# Patient Record
Sex: Male | Born: 1980 | Race: Black or African American | Hispanic: No | Marital: Single | Smoking: Current every day smoker
Health system: Southern US, Community
[De-identification: ages and names within clinical notes are randomized; demographics above are authoritative.]

---

## 2005-12-18 ENCOUNTER — Emergency Department (HOSPITAL_COMMUNITY): Admission: EM | Admit: 2005-12-18 | Discharge: 2005-12-18 | Payer: Self-pay | Admitting: Family Medicine

## 2006-08-25 ENCOUNTER — Emergency Department (HOSPITAL_COMMUNITY): Admission: EM | Admit: 2006-08-25 | Discharge: 2006-08-25 | Payer: Self-pay | Admitting: Family Medicine

## 2007-02-11 ENCOUNTER — Emergency Department (HOSPITAL_COMMUNITY): Admission: EM | Admit: 2007-02-11 | Discharge: 2007-02-11 | Payer: Self-pay | Admitting: Emergency Medicine

## 2007-03-25 ENCOUNTER — Emergency Department (HOSPITAL_COMMUNITY): Admission: EM | Admit: 2007-03-25 | Discharge: 2007-03-25 | Payer: Self-pay | Admitting: Emergency Medicine

## 2007-07-29 ENCOUNTER — Emergency Department (HOSPITAL_COMMUNITY): Admission: EM | Admit: 2007-07-29 | Discharge: 2007-07-29 | Payer: Self-pay | Admitting: Emergency Medicine

## 2008-01-06 ENCOUNTER — Emergency Department (HOSPITAL_COMMUNITY): Admission: EM | Admit: 2008-01-06 | Discharge: 2008-01-06 | Payer: Self-pay | Admitting: Emergency Medicine
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
## 2010-03-01 ENCOUNTER — Emergency Department (HOSPITAL_COMMUNITY): Admission: EM | Admit: 2010-03-01 | Discharge: 2010-03-01 | Payer: Self-pay | Admitting: Family Medicine

## 2010-08-18 ENCOUNTER — Ambulatory Visit (HOSPITAL_COMMUNITY)
Admission: RE | Admit: 2010-08-18 | Discharge: 2010-08-18 | Payer: Self-pay | Source: Home / Self Care | Admitting: Family Medicine

## 2011-07-17 LAB — CULTURE, ROUTINE-ABSCESS: Gram Stain: NONE SEEN

## 2016-02-19 ENCOUNTER — Encounter (HOSPITAL_BASED_OUTPATIENT_CLINIC_OR_DEPARTMENT_OTHER): Payer: Self-pay | Admitting: *Deleted

## 2016-02-19 ENCOUNTER — Emergency Department (HOSPITAL_BASED_OUTPATIENT_CLINIC_OR_DEPARTMENT_OTHER)
Admission: EM | Admit: 2016-02-19 | Discharge: 2016-02-20 | Disposition: A | Discharge status: Home / Self Care | Payer: 59 | Attending: Emergency Medicine | Admitting: Emergency Medicine

## 2016-02-19 DIAGNOSIS — F17210 Nicotine dependence, cigarettes, uncomplicated: Secondary | ICD-10-CM | POA: Insufficient documentation

## 2016-02-19 DIAGNOSIS — R369 Urethral discharge, unspecified: Secondary | ICD-10-CM | POA: Diagnosis present

## 2016-02-19 LAB — URINALYSIS, ROUTINE W REFLEX MICROSCOPIC
Bilirubin Urine: NEGATIVE
GLUCOSE, UA: NEGATIVE mg/dL
KETONES UR: NEGATIVE mg/dL
Nitrite: NEGATIVE
PROTEIN: NEGATIVE mg/dL
Specific Gravity, Urine: 1.005 (ref 1.005–1.030)
pH: 6 (ref 5.0–8.0)

## 2016-02-19 LAB — URINE MICROSCOPIC-ADD ON

## 2016-02-19 MED ORDER — LIDOCAINE HCL (PF) 1 % IJ SOLN
INTRAMUSCULAR | Status: AC
  Administered 2016-02-19: 5 mL
  Filled 2016-02-19: qty 5

## 2016-02-19 MED ORDER — AZITHROMYCIN 250 MG PO TABS
1000.0000 mg | ORAL_TABLET | Freq: Once | ORAL | Status: AC
  Administered 2016-02-19: 1000 mg via ORAL
  Filled 2016-02-19: qty 4

## 2016-02-19 MED ORDER — CEFTRIAXONE SODIUM 250 MG IJ SOLR
250.0000 mg | Freq: Once | INTRAMUSCULAR | Status: AC
  Administered 2016-02-19: 250 mg via INTRAMUSCULAR
  Filled 2016-02-19: qty 250

## 2016-02-19 NOTE — ED Notes (Signed)
C/o penile dc, increased urinary freq,  Denies burning w urination x 2 days

## 2016-02-19 NOTE — ED Notes (Signed)
Penile discharge since yesterday.  

## 2016-02-20 NOTE — ED Provider Notes (Signed)
CSN: [id number]     Arrival date & time 02/19/16  1853 History   First MD Initiated Contact with Patient 02/19/16 2306     Chief Complaint  Patient presents with  . Penile Discharge     (Consider location/radiation/quality/duration/timing/severity/associated sxs/prior Treatment) HPI Comments: 35 y.o. Male with no significant past medical history presents for penile discharge.  The patient denies other symptoms.  It does burn after he urinates but not during urination.  Reports he was having sex the other night and the condom broke and since then he has developed discharge.  No fever, chills, nausea, vomiting, abdominal pain.   History reviewed. No pertinent past medical history. History reviewed. No pertinent past surgical history. No family history on file. Social History  Substance Use Topics  . Smoking status: Current Every Day Smoker    Types: Cigarettes  . Smokeless tobacco: None  . Alcohol Use: Yes     Comment: daily    Review of Systems  Constitutional: Negative for fever and chills.  HENT: Negative for congestion.   Genitourinary: Positive for dysuria, frequency and discharge. Negative for urgency, hematuria and difficulty urinating.  Hematological: Does not bruise/bleed easily.      Allergies  Review of patient's allergies indicates no known allergies.  Home Medications   Prior to Admission medications   Not on File   BP 149/88 mmHg  Pulse 84  Temp(Src) 98.8 F (37.1 C) (Oral)  Resp 16  Ht  (1.651 m)  Wt 120 lb (54.432 kg)  BMI 19.97 kg/m2  SpO2 100% Physical Exam  Constitutional: He is oriented to person, place, and time. He appears well-developed and well-nourished. No distress.  HENT:  Head: Normocephalic and atraumatic.  Right Ear: External ear normal.  Left Ear: External ear normal.  Mouth/Throat: Oropharynx is clear and moist. No oropharyngeal exudate.  Eyes: EOM are normal. Pupils are equal, round, and reactive to light.  Neck: Normal  range of motion. Neck supple.  Cardiovascular: Normal rate, regular rhythm, normal heart sounds and intact distal pulses.   No murmur heard. Pulmonary/Chest: Effort normal. No respiratory distress. He has no wheezes. He has no rales.  Abdominal: Soft. He exhibits no distension. There is no tenderness.  Genitourinary: Testes normal. No penile erythema or penile tenderness. Discharge (thick, yellowish) found.  Musculoskeletal: He exhibits no edema.  Neurological: He is alert and oriented to person, place, and time.  Skin: Skin is warm and dry. No rash noted. He is not diaphoretic.  Vitals reviewed.   ED Course  Procedures (including critical care time) Labs Review Labs Reviewed  URINALYSIS, ROUTINE W REFLEX MICROSCOPIC (NOT AT [facility]) - Abnormal; Notable for the following:    APPearance CLOUDY (*)    Hgb urine dipstick SMALL (*)    Leukocytes, UA LARGE (*)    All other components within normal limits  URINE MICROSCOPIC-ADD ON - Abnormal; Notable for the following:    Squamous Epithelial / LPF 0-5 (*)    Bacteria, UA MANY (*)    All other components within normal limits  GC/CHLAMYDIA PROBE AMP ([place]) NOT AT [facility]    Imaging Review No results found. I have personally reviewed and evaluated these images and lab results as part of my medical decision-making.   EKG Interpretation None      MDM  Patient seen and evaluated in stable condition.  UA with bacteria and WBCs likely secondary to STD.  Patient treated with Rocephin and Azithromycin.  GC/Chlamydia cultures sent.  Patient  discharged home with instruction to inform sexual partners. Final diagnoses:  Penile discharge    1. Treatment for STD    Leta BaptistEmily Roe Asheley Hellberg, MD 02/20/16 [phone]

## 2016-02-20 NOTE — Discharge Instructions (Signed)
You were seen and evaluated today for your discharge. Likely this is related to an STD. You have been treated with antibiotics in the emergency department for STDs. Please inform any sexual partners that you have that they should be tested.  Sexually Transmitted Disease A sexually transmitted disease (STD) is a disease or infection that may be passed (transmitted) from person to person, usually during sexual activity. This may happen by way of saliva, semen, blood, vaginal mucus, or urine. Common STDs include:  Gonorrhea.  Chlamydia.  Syphilis.  HIV and AIDS.  Genital herpes.  Hepatitis B and C.  Trichomonas.  Human papillomavirus (HPV).  Pubic lice.  Scabies.  Mites.  Bacterial vaginosis. WHAT ARE CAUSES OF STDs? An STD may be caused by bacteria, a virus, or parasites. STDs are often transmitted during sexual activity if one person is infected. However, they may also be transmitted through nonsexual means. STDs may be transmitted after:   Sexual intercourse with an infected person.  Sharing sex toys with an infected person.  Sharing needles with an infected person or using unclean piercing or tattoo needles.  Having intimate contact with the genitals, mouth, or rectal areas of an infected person.  Exposure to infected fluids during birth. WHAT ARE THE SIGNS AND SYMPTOMS OF STDs? Different STDs have different symptoms. Some people may not have any symptoms. If symptoms are present, they may include:  Painful or bloody urination.  Pain in the pelvis, abdomen, vagina, anus, throat, or eyes.  A skin rash, itching, or irritation.  Growths, ulcerations, blisters, or sores in the genital and anal areas.  Abnormal vaginal discharge with or without bad odor.  Penile discharge in men.  Fever.  Pain or bleeding during sexual intercourse.  Swollen glands in the groin area.  Yellow skin and eyes (jaundice). This is seen with hepatitis.  Swollen  testicles.  Infertility.  Sores and blisters in the mouth. HOW ARE STDs DIAGNOSED? To make a diagnosis, your health care provider may:  Take a medical history.  Perform a physical exam.  Take a sample of any discharge to examine.  Swab the throat, cervix, opening to the penis, rectum, or vagina for testing.  Test a sample of your first morning urine.  Perform blood tests.  Perform a Pap test, if this applies.  Perform a colposcopy.  Perform a laparoscopy. HOW ARE STDs TREATED? Treatment depends on the STD. Some STDs may be treated but not cured.  Chlamydia, gonorrhea, trichomonas, and syphilis can be cured with antibiotic medicine.  Genital herpes, hepatitis, and HIV can be treated, but not cured, with prescribed medicines. The medicines lessen symptoms.  Genital warts from HPV can be treated with medicine or by freezing, burning (electrocautery), or surgery. Warts may come back.  HPV cannot be cured with medicine or surgery. However, abnormal areas may be removed from the cervix, vagina, or vulva.  If your diagnosis is confirmed, your recent sexual partners need treatment. This is true even if they are symptom-free or have a negative culture or evaluation. They should not have sex until their health care providers say it is okay.  Your health care provider may test you for infection again 3 months after treatment. HOW CAN I REDUCE MY RISK OF GETTING AN STD? Take these steps to reduce your risk of getting an STD:  Use latex condoms, dental dams, and water-soluble lubricants during sexual activity. Do not use petroleum jelly or oils.  Avoid having multiple sex partners.  Do not have sex  with someone who has other sex partners  Do not have sex with anyone you do not know or who is at high risk for an STD.  Avoid risky sex practices that can break your skin.  Do not have sex if you have open sores on your mouth or skin.  Avoid drinking too much alcohol or taking  illegal drugs. Alcohol and drugs can affect your judgment and put you in a vulnerable position.  Avoid engaging in oral and anal sex acts.  Get vaccinated for HPV and hepatitis. If you have not received these vaccines in the past, talk to your health care provider about whether one or both might be right for you.  If you are at risk of being infected with HIV, it is recommended that you take a prescription medicine daily to prevent HIV infection. This is called pre-exposure prophylaxis (PrEP). You are considered at risk if:  You are a man who has sex with other men (MSM).  You are a heterosexual man or woman and are sexually active with more than one partner.  You take drugs by injection.  You are sexually active with a partner who has HIV.  Talk with your health care provider about whether you are at high risk of being infected with HIV. If you choose to begin PrEP, you should first be tested for HIV. You should then be tested every 3 months for as long as you are taking PrEP. WHAT SHOULD I DO IF I THINK I HAVE AN STD?  See your health care provider.  Tell your sexual partner(s). They should be tested and treated for any STDs.  Do not have sex until your health care provider says it is okay. WHEN SHOULD I GET IMMEDIATE MEDICAL CARE? Contact your health care provider right away if:   You have severe abdominal pain.  You are a man and notice swelling or pain in your testicles.  You are a woman and notice swelling or pain in your vagina.   This information is not intended to replace advice given to you by your health care provider. Make sure you discuss any questions you have with your health care provider.   Document Released: 12/07/2002 Document Revised: 10/07/2014 Document Reviewed: 04/06/2013 [employer] Interactive Patient Education [employer]

## 2016-02-21 LAB — GC/CHLAMYDIA PROBE AMP (~~LOC~~) NOT AT ARMC
CHLAMYDIA, DNA PROBE: NEGATIVE
NEISSERIA GONORRHEA: POSITIVE — AB

## 2016-02-22 ENCOUNTER — Telehealth (HOSPITAL_BASED_OUTPATIENT_CLINIC_OR_DEPARTMENT_OTHER): Payer: Self-pay | Admitting: Emergency Medicine
